# Patient Record
Sex: Male | Born: 1985 | Race: White | Hispanic: No | Marital: Single
Health system: Southern US, Community
[De-identification: ages and names within clinical notes are randomized; demographics above are authoritative.]

---

## 2019-03-01 ENCOUNTER — Emergency Department (HOSPITAL_COMMUNITY)
Admission: EM | Admit: 2019-03-01 | Discharge: 2019-03-01 | Disposition: A | Discharge status: Home / Self Care | Payer: Self-pay | Attending: Emergency Medicine | Admitting: Emergency Medicine

## 2019-03-01 ENCOUNTER — Emergency Department (HOSPITAL_COMMUNITY): Payer: Self-pay

## 2019-03-01 ENCOUNTER — Other Ambulatory Visit: Payer: Self-pay

## 2019-03-01 DIAGNOSIS — T171XXA Foreign body in nostril, initial encounter: Secondary | ICD-10-CM | POA: Insufficient documentation

## 2019-03-01 DIAGNOSIS — Y9389 Activity, other specified: Secondary | ICD-10-CM | POA: Insufficient documentation

## 2019-03-01 DIAGNOSIS — W34010A Accidental discharge of airgun, initial encounter: Secondary | ICD-10-CM | POA: Insufficient documentation

## 2019-03-01 DIAGNOSIS — Y92838 Other recreation area as the place of occurrence of the external cause: Secondary | ICD-10-CM | POA: Insufficient documentation

## 2019-03-01 DIAGNOSIS — Y998 Other external cause status: Secondary | ICD-10-CM | POA: Insufficient documentation
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
## 2019-03-01 NOTE — Discharge Instructions (Addendum)
Please follow-up with Dr. Jenne Pane of ENT.  Please use Tylenol or ibuprofen for pain.  You may use 600 mg ibuprofen every 6 hours or 1000 mg of Tylenol every 6 hours.  You may choose to alternate between the 2.  This would be most effective.  Not to exceed 4 g of Tylenol within 24 hours.  Not to exceed 3200 mg ibuprofen 24 hours.
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
## 2019-03-01 NOTE — ED Notes (Signed)
Patient transported to X-ray 
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
